# Patient Record
Sex: Female | Born: 1970 | Race: White | Hispanic: No | Marital: Married | Smoking: Never smoker
Health system: Southern US, Community
[De-identification: ages and names within clinical notes are randomized; demographics above are authoritative.]

## PROBLEM LIST (undated history)

## (undated) DIAGNOSIS — I10 Essential (primary) hypertension: Secondary | ICD-10-CM

## (undated) HISTORY — PX: HERNIA REPAIR: SHX51

---

## 2007-07-04 ENCOUNTER — Ambulatory Visit: Payer: Self-pay | Admitting: Family Medicine

## 2007-10-31 ENCOUNTER — Emergency Department: Payer: Self-pay | Admitting: Emergency Medicine

## 2008-02-08 ENCOUNTER — Ambulatory Visit: Payer: Self-pay | Admitting: Gastroenterology

## 2008-02-25 ENCOUNTER — Emergency Department: Payer: Self-pay | Admitting: Emergency Medicine

## 2008-02-25 ENCOUNTER — Other Ambulatory Visit: Payer: Self-pay

## 2008-02-26 ENCOUNTER — Other Ambulatory Visit: Payer: Self-pay

## 2009-03-10 ENCOUNTER — Ambulatory Visit: Payer: Self-pay | Admitting: Internal Medicine

## 2009-08-07 ENCOUNTER — Ambulatory Visit: Payer: Self-pay | Admitting: Family Medicine

## 2009-08-15 ENCOUNTER — Ambulatory Visit: Payer: Self-pay | Admitting: Internal Medicine

## 2012-04-28 ENCOUNTER — Ambulatory Visit: Payer: Self-pay

## 2012-07-20 ENCOUNTER — Ambulatory Visit: Payer: Self-pay | Admitting: Family Medicine

## 2013-07-26 ENCOUNTER — Ambulatory Visit: Payer: Self-pay | Admitting: Family Medicine

## 2014-01-24 ENCOUNTER — Ambulatory Visit: Payer: Self-pay | Admitting: Family Medicine

## 2014-08-01 ENCOUNTER — Ambulatory Visit: Payer: Self-pay | Admitting: Family Medicine

## 2014-08-14 ENCOUNTER — Ambulatory Visit: Payer: Self-pay | Admitting: Family Medicine

## 2016-01-08 ENCOUNTER — Other Ambulatory Visit: Payer: Self-pay | Admitting: Family Medicine

## 2016-01-08 DIAGNOSIS — Z8639 Personal history of other endocrine, nutritional and metabolic disease: Secondary | ICD-10-CM

## 2016-01-16 ENCOUNTER — Ambulatory Visit
Admission: RE | Admit: 2016-01-16 | Discharge: 2016-01-16 | Disposition: A | Discharge status: Home / Self Care | Payer: PRIVATE HEALTH INSURANCE | Source: Ambulatory Visit | Attending: Family Medicine | Admitting: Family Medicine

## 2016-01-16 DIAGNOSIS — Z8639 Personal history of other endocrine, nutritional and metabolic disease: Secondary | ICD-10-CM | POA: Insufficient documentation

## 2016-01-16 DIAGNOSIS — E041 Nontoxic single thyroid nodule: Secondary | ICD-10-CM | POA: Insufficient documentation

## 2017-06-16 IMAGING — US US THYROID
1 series · 14 of 25 positions shown · non-contrast
Comparison: 08/07/2009

CLINICAL DATA: History of thyroid nodule.

EXAM:
THYROID ULTRASOUND
TECHNIQUE: Ultrasound examination of the thyroid gland and adjacent soft
tissues was performed.

[Series 1: us thyroid · 0.07mm/px · 14 of 44 slices shown]
[im 1/44]
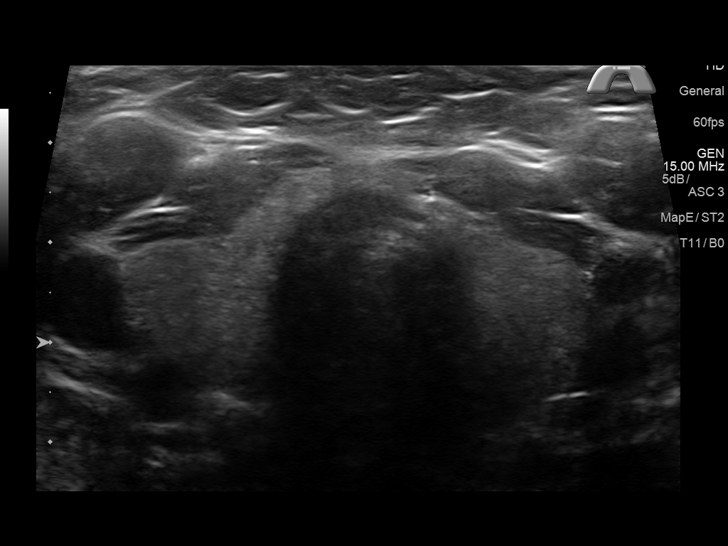
[im 4/44]
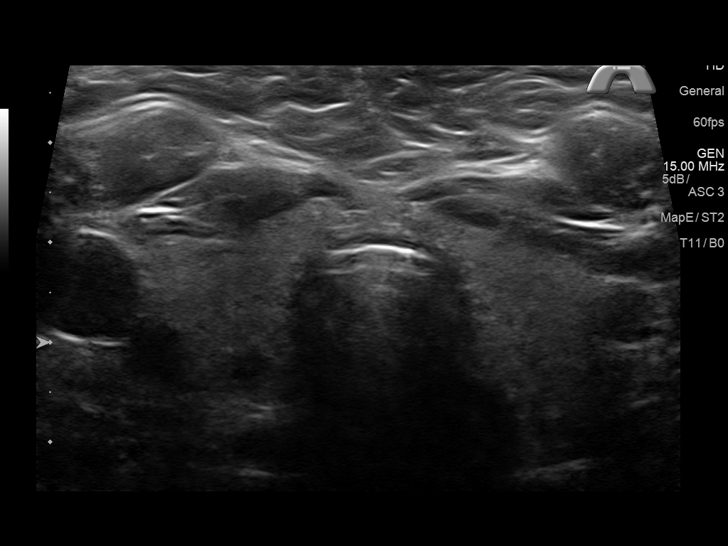
[im 8/44]
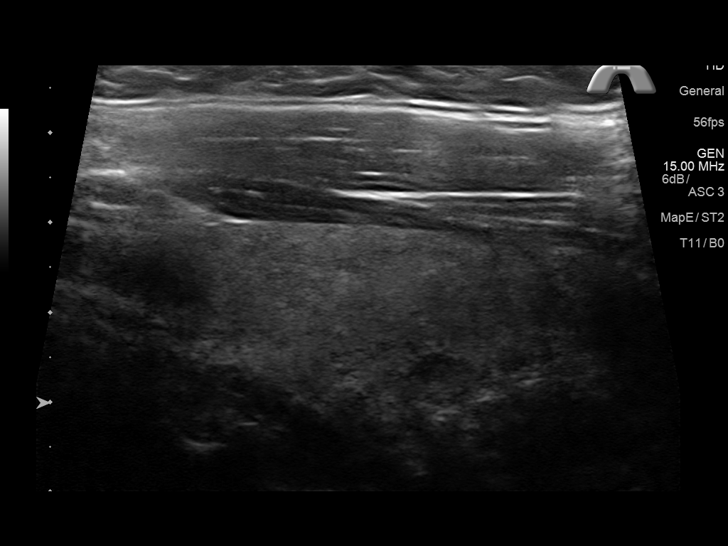
[im 11/44]
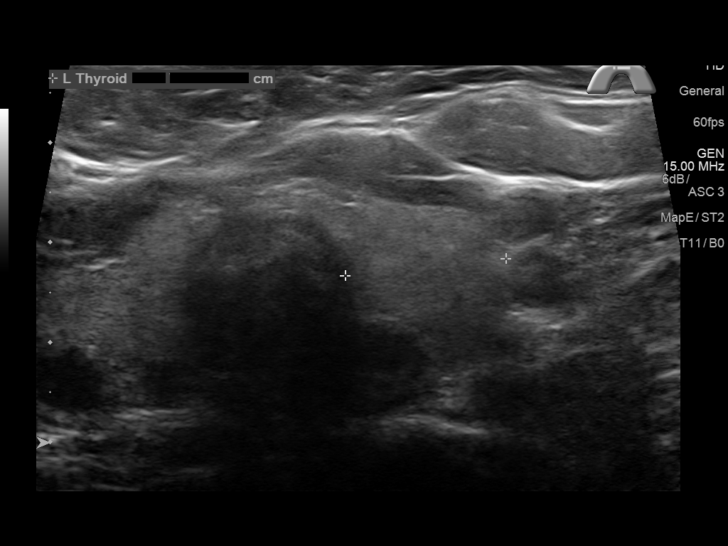
[im 15/44]
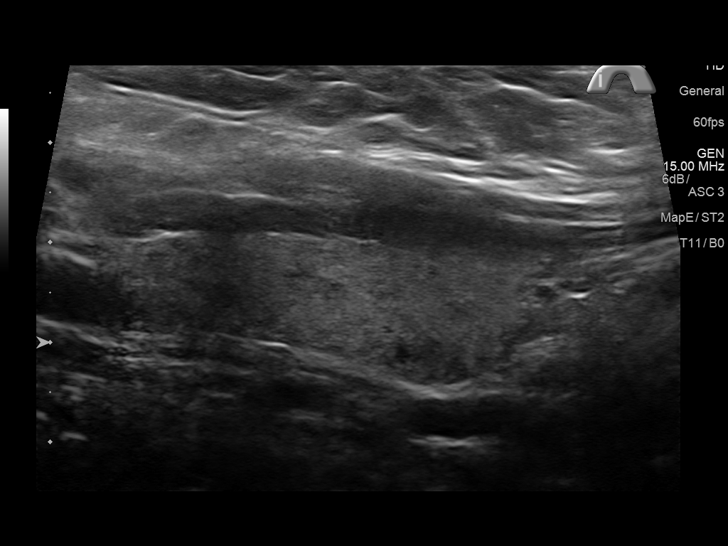
[im 17/44]
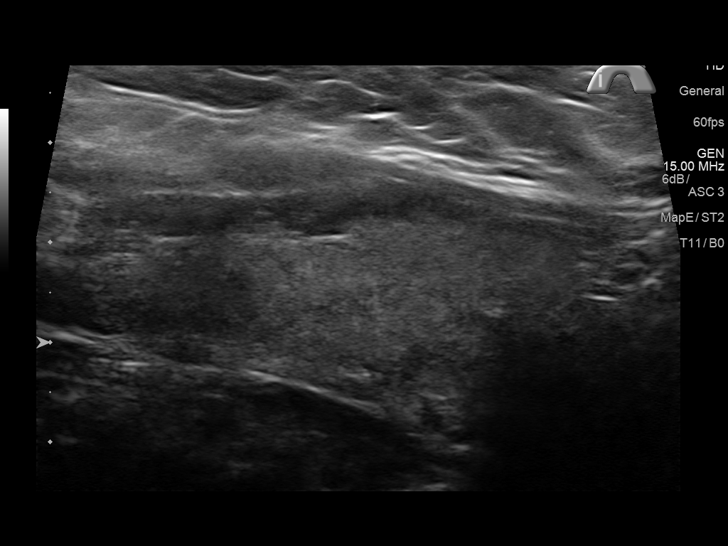
[im 20/44]
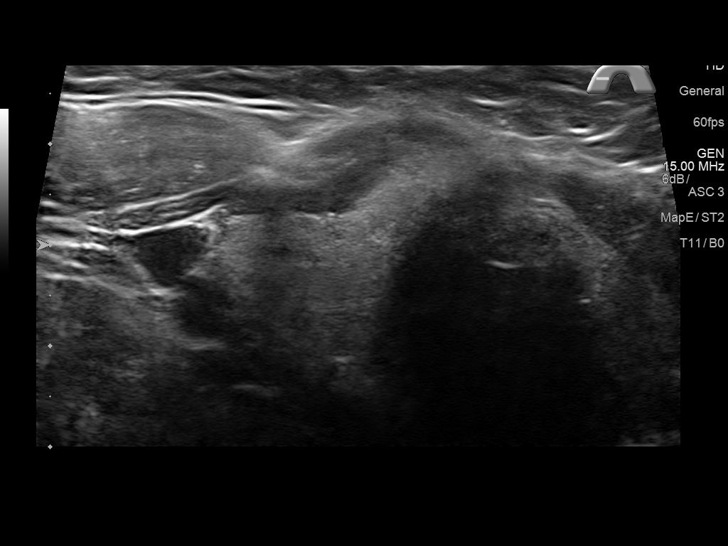
[im 24/44]
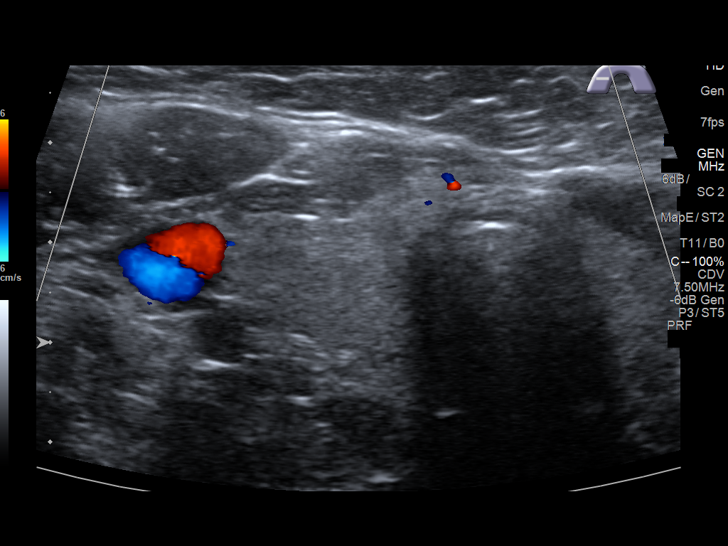
[im 27/44]
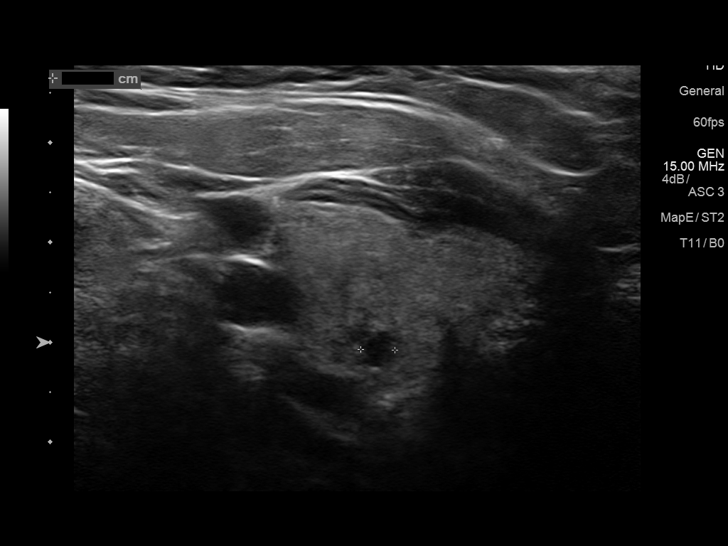
[im 29/44]
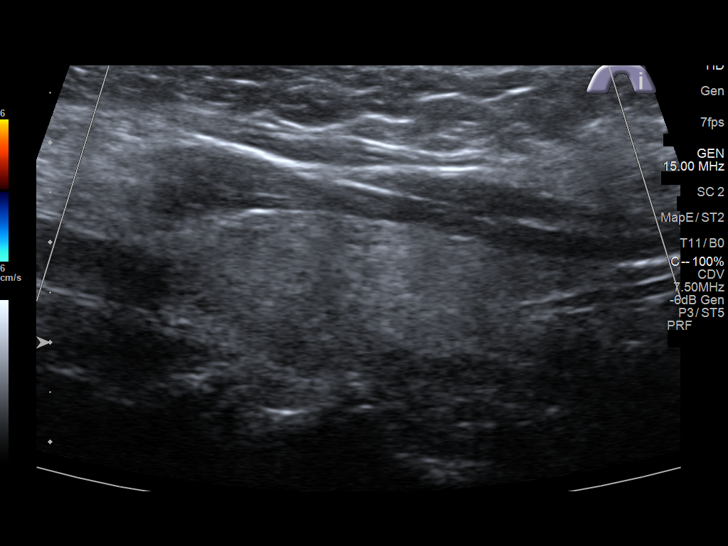
[im 33/44]
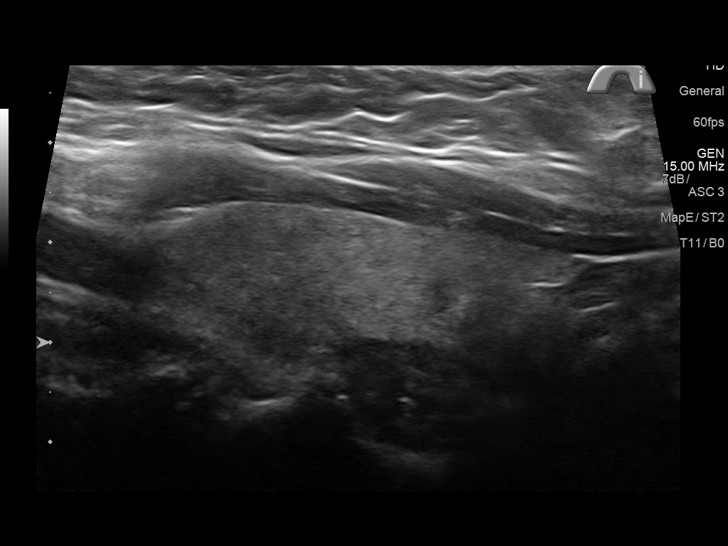
[im 36/44]
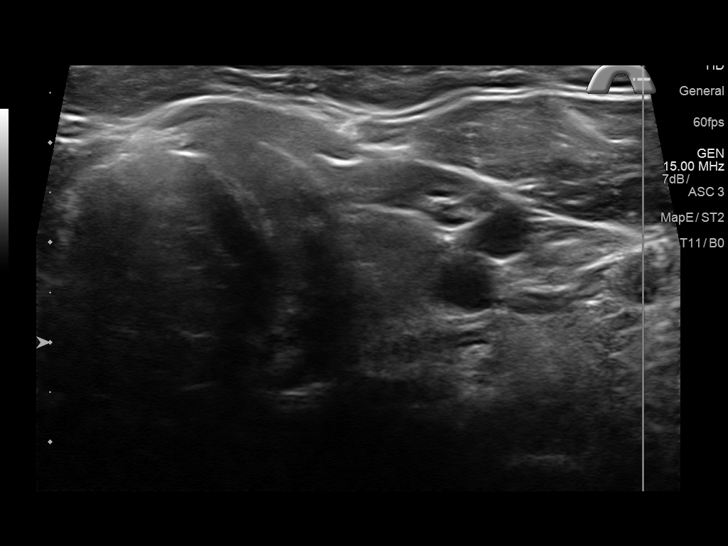
[im 40/44]
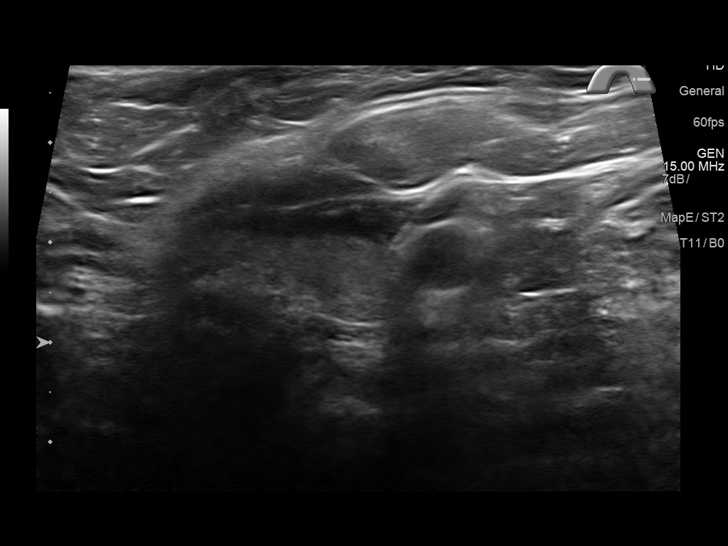
[im 44/44]
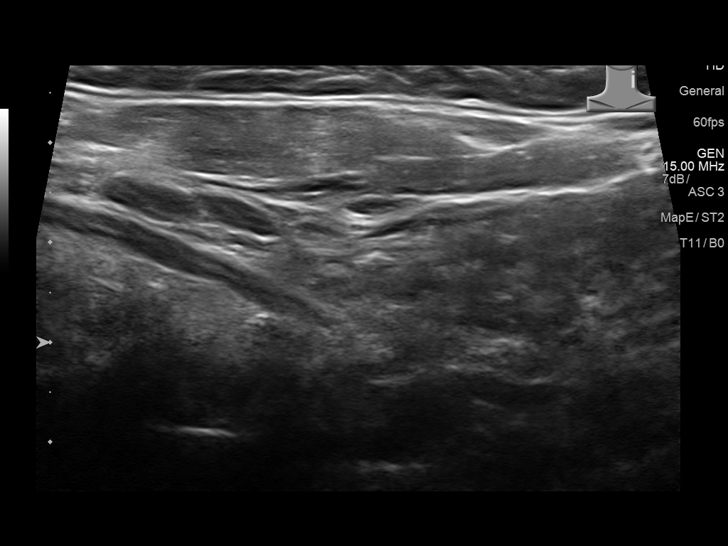

[14 of 25 positions shown; findings below may reference images not displayed]

FINDINGS: Right thyroid lobe

Measurements: 4.2 x 1.8 x 1.4 cm.

0.3 cm cyst in the right lobe is in a different location compared to
the previously identified cyst and has a benign appearance.
Previously noted cyst appears to have involuted.

Left thyroid lobe

Measurements: 3.6 x 1.3 x 1.6 cm.  No nodules visualized.

Isthmus

Thickness: 0.2 cm.  No nodules visualized.

Lymphadenopathy

None visualized.
IMPRESSION: Involution of previously identified 0.4 cm cystic nodule. A separate
0.3 cm nodule is identified which has a benign cystic appearance.

## 2018-09-28 ENCOUNTER — Encounter: Payer: Self-pay | Admitting: Emergency Medicine

## 2018-09-28 ENCOUNTER — Other Ambulatory Visit: Payer: Self-pay

## 2018-09-28 ENCOUNTER — Ambulatory Visit
Admission: EM | Admit: 2018-09-28 | Discharge: 2018-09-28 | Disposition: A | Discharge status: Home / Self Care | Payer: BLUE CROSS/BLUE SHIELD | Attending: Family Medicine | Admitting: Family Medicine

## 2018-09-28 DIAGNOSIS — J069 Acute upper respiratory infection, unspecified: Secondary | ICD-10-CM | POA: Insufficient documentation

## 2018-09-28 HISTORY — DX: Essential (primary) hypertension: I10

## 2018-09-28 NOTE — Discharge Instructions (Signed)
Over the counter medication as discussed. Rest. Drink plenty of fluids.  ° °Follow up with your primary care physician this week as needed. Return to Urgent care for new or worsening concerns.  ° °

## 2018-09-28 NOTE — ED Provider Notes (Signed)
[facility] ____________________________________________  Time seen: Approximately 10:25 AM  I have reviewed the triage vital signs and the nursing notes.   HISTORY  Chief Complaint Sinus Problem and Nasal Congestion   HPI Jennifer Fields is a 47 y.o. female presenting with husband at bedside for evaluation of runny nose, nasal congestion, sinus pressure.  Reports some intermittent coughing as well as left ear pain today.  Patient reports that she has a history of recurrent sinus infections and expresses concern of same.  Reports symptoms have been present for the last 3 days.  States otherwise prior to the last 3 days she has had intermittent nasal congestion consistent with baseline allergies.  Occasional scratchy throat.  Denies accompanying fevers.  Has continued to remain active, continues to eat and drink well.  States son home visiting just prior to her symptoms onset and states that he was sick with similar.  States sinus pressure is currently mild.  Has been using her home allergy nasal sprays as well as her Zyrtec.  Denies other aggravating leaving factors.  Denies chest pain, shortness of breath or abdominal pain.  Reports otherwise doing well denies other complaints.  Dione Housekeeperlmedo, Mario Ernesto, MD: PCP    Past Medical History:  Diagnosis Date  . Hypertension     There are no active problems to display for this patient.   Past Surgical History:  Procedure Laterality Date  . HERNIA REPAIR       No current facility-administered medications for this encounter.   Current Outpatient Medications:  .  LORazepam (ATIVAN) 1 MG tablet, Take by mouth., Disp: , Rfl:  .  losartan (COZAAR) 25 MG tablet, Take by mouth., Disp: , Rfl:  .  Multiple Vitamin (MULTIVITAMIN) tablet, Take by mouth., Disp: , Rfl:  .  RA KRILL OIL 500 MG CAPS, Take by mouth., Disp: , Rfl:  .  sertraline (ZOLOFT) 100 MG tablet, 200mg  daily, Disp: , Rfl:  .  Vitamin D, Ergocalciferol, (DRISDOL)  1.25 MG (50000 UT) CAPS capsule, , Disp: , Rfl:   Allergies Ciprofloxacin; Penicillins; Desloratadine; and Other  Family History  Problem Relation Age of Onset  . Hypertension Mother   . Atrial fibrillation Mother   . Hashimoto's thyroiditis Mother   . Hypertension Father     Social History Social History   Tobacco Use  . Smoking status: Never Smoker  . Smokeless tobacco: Never Used  Substance Use Topics  . Alcohol use: Never    Frequency: Never  . Drug use: Never    Review of Systems Constitutional: No fever ENT: as above Cardiovascular: Denies chest pain. Respiratory: Denies shortness of breath. Gastrointestinal: No abdominal pain.   Musculoskeletal: Negative for back pain. Skin: Negative for rash.   ____________________________________________   PHYSICAL EXAM:  VITAL SIGNS: ED Triage Vitals  Enc Vitals Group     BP 09/28/18 0937 (!) 141/84     Pulse Rate 09/28/18 0937 82     Resp 09/28/18 0937 14     Temp 09/28/18 0937 98.1 F (36.7 C)     Temp Source 09/28/18 0937 Oral     SpO2 09/28/18 0937 100 %     Weight 09/28/18 0933 205 lb (93 kg)     Height 09/28/18 0933 5\' 8"  (1.727 m)     Head Circumference --      Peak Flow --      Pain Score 09/28/18 0932 4     Pain Loc --      Pain  Edu? --      Excl. in GC? --     Constitutional: Alert and oriented. Well appearing and in no acute distress. Eyes: Conjunctivae are normal.  Head: Atraumatic.  Mild frontal bilateral sinus tenderness palpation.  No maxillary sinus tenderness.  No swelling. No erythema.  Ears: no erythema, normal TMs bilaterally.   Nose:Nasal congestion with clear rhinorrhea  Mouth/Throat: Mucous membranes are moist. No pharyngeal erythema. No tonsillar swelling or exudate.  Neck: No stridor.  No cervical spine tenderness to palpation. Hematological/Lymphatic/Immunilogical: No cervical lymphadenopathy. Cardiovascular: Normal rate, regular rhythm. Grossly normal heart sounds.  Good  peripheral circulation. Respiratory: Normal respiratory effort.  No retractions. No wheezes, rales or rhonchi. Good air movement.  Musculoskeletal: Ambulatory with steady gait. Neurologic:  Normal speech and language. No gait instability. Skin:  Skin appears warm, dry and intact. No rash noted. Psychiatric: Mood and affect are normal. Speech and behavior are normal. ___________________________________________   LABS (all labs ordered are listed, but only abnormal results are displayed)  Labs Reviewed - No data to display ____________________________________________   PROCEDURES Procedures    INITIAL IMPRESSION / ASSESSMENT AND PLAN / ED COURSE  Pertinent labs & imaging results that were available during my care of the patient were reviewed by me and considered in my medical decision making (see chart for details).  Well-appearing patient.  No acute distress. Suspect viral upper respiratory infection.  Counseled regarding home supportive care, monitoring and follow-up as needed.  Discussed follow up with Primary care physician this week. Discussed follow up and return parameters including no resolution or any worsening concerns. Patient verbalized understanding and agreed to plan.   ____________________________________________   FINAL CLINICAL IMPRESSION(S) / ED DIAGNOSES  Final diagnoses:  Upper respiratory tract infection, unspecified type     ED Discharge Orders    None       Note: This dictation was prepared with Dragon dictation along with smaller phrase technology. Any transcriptional errors that result from this process are unintentional.         Renford DillsMiller, [place] Alexa, NP 09/28/18 1027

## 2018-09-28 NOTE — ED Triage Notes (Signed)
Patient c/o sinus congestion and stuffy nose and nasal congestion that started yesterday.  Patient reports cold symptoms about 3 days prior to the sinus symptoms. Patient denies fevers.

## 2019-11-10 ENCOUNTER — Encounter: Payer: Self-pay | Admitting: Emergency Medicine

## 2019-11-10 ENCOUNTER — Ambulatory Visit
Admission: EM | Admit: 2019-11-10 | Discharge: 2019-11-10 | Disposition: A | Discharge status: Home / Self Care | Payer: BC Managed Care – PPO | Attending: Family Medicine | Admitting: Family Medicine

## 2019-11-10 ENCOUNTER — Other Ambulatory Visit: Payer: Self-pay

## 2019-11-10 DIAGNOSIS — M7918 Myalgia, other site: Secondary | ICD-10-CM | POA: Diagnosis not present

## 2019-11-10 DIAGNOSIS — J0100 Acute maxillary sinusitis, unspecified: Secondary | ICD-10-CM | POA: Diagnosis not present

## 2019-11-10 MED ORDER — MELOXICAM 15 MG PO TABS: 15.0000 mg | ORAL_TABLET | Freq: Every day | ORAL | 0 refills | Status: DC | PRN

## 2019-11-10 MED ORDER — DOXYCYCLINE HYCLATE 100 MG PO CAPS: 100.0000 mg | ORAL_CAPSULE | Freq: Two times a day (BID) | ORAL | 0 refills | Status: DC

## 2019-11-10 MED ORDER — TIZANIDINE HCL 4 MG PO TABS: 4.0000 mg | ORAL_TABLET | Freq: Three times a day (TID) | ORAL | 0 refills | Status: AC | PRN

## 2019-11-10 NOTE — ED Provider Notes (Signed)
[facility]    CSN: [id number] Arrival date & time: 11/10/19  1572  History   Chief Complaint Chief Complaint  Patient presents with  . Neck Pain  . Nasal Congestion   HPI  49 year old female presents with the above complaints.  Patient reports ongoing musculoskeletal pain for the past 3 weeks.  Reports neck pain and upper back pain. Feels tense. Started after change in the weather.  Pain 8/10 in severity.  No relieving factors.  She has taken Tylenol without resolution.  Additionally, patient states that she has had ongoing sinus pain and pressure as well as postnasal drip for the past 3 weeks.  Likely coincided with recent weather changes.  No fever.  She has had no relief.  Seems to be persistent.  No other associated symptoms.  No other complaints at this time.  Past Medical History:  Diagnosis Date  . Hypertension    Past Surgical History:  Procedure Laterality Date  . HERNIA REPAIR     OB History   No obstetric history on file.    Home Medications    Prior to Admission medications   Medication Sig Start Date End Date Taking? Authorizing Provider  LORazepam (ATIVAN) 1 MG tablet Take by mouth. 05/24/08  Yes [provider]  losartan (COZAAR) 25 MG tablet Take by mouth. 12/14/17  Yes [provider]  Multiple Vitamin (MULTIVITAMIN) tablet Take by mouth.   Yes [provider]  RA KRILL OIL 500 MG CAPS Take by mouth.   Yes [provider]  sertraline (ZOLOFT) 100 MG tablet 200mg  daily 09/07/09  Yes [provider]  Vitamin D, Ergocalciferol, (DRISDOL) 1.25 MG (50000 UT) CAPS capsule  07/10/14  Yes [provider]  doxycycline (VIBRAMYCIN) 100 MG capsule Take 1 capsule (100 mg total) by mouth 2 (two) times daily. 11/10/19   Coral Spikes, DO  meloxicam (MOBIC) 15 MG tablet Take 1 tablet (15 mg total) by mouth daily as needed for pain. 11/10/19   Coral Spikes, DO  tiZANidine (ZANAFLEX) 4 MG tablet Take 1 tablet (4 mg  total) by mouth every 8 (eight) hours as needed for muscle spasms. 11/10/19   Coral Spikes, DO    Family History Family History  Problem Relation Age of Onset  . Hypertension Mother   . Atrial fibrillation Mother   . Hashimoto's thyroiditis Mother   . Hypertension Father     Social History Social History   Tobacco Use  . Smoking status: Never Smoker  . Smokeless tobacco: Never Used  Substance Use Topics  . Alcohol use: Never  . Drug use: Never     Allergies   Ciprofloxacin, Penicillins, Desloratadine, and Other   Review of Systems Review of Systems  Constitutional: Negative for fever.  HENT: Positive for postnasal drip, sinus pressure and sinus pain.   Musculoskeletal: Positive for back pain and neck pain.   Physical Exam Triage Vital Signs ED Triage Vitals  Enc Vitals Group     BP 11/10/19 1029 (!) 151/85     Pulse Rate 11/10/19 1029 95     Resp 11/10/19 1029 18     Temp 11/10/19 1029 98.4 F (36.9 C)     Temp Source 11/10/19 1029 Oral     SpO2 11/10/19 1029 100 %     Weight 11/10/19 1027 205 lb (93 kg)     Height 11/10/19 1027 5\' 8"  (1.727 m)     Head Circumference --  Peak Flow --      Pain Score 11/10/19 1026 8     Pain Loc --      Pain Edu? --      Excl. in GC? --    Updated Vital Signs BP (!) 151/85 (BP Location: Right Arm)   Pulse 95   Temp 98.4 F (36.9 C) (Oral)   Resp 18   Ht 5\' 8"  (1.727 m)   Wt 93 kg   LMP 10/17/2019   SpO2 100%   BMI 31.17 kg/m   Visual Acuity Right Eye Distance:   Left Eye Distance:   Bilateral Distance:    Right Eye Near:   Left Eye Near:    Bilateral Near:     Physical Exam Vitals and nursing note reviewed.  Constitutional:      General: She is not in acute distress.    Appearance: Normal appearance. She is not ill-appearing.  HENT:     Head: Normocephalic and atraumatic.     Right Ear: Tympanic membrane normal.     Left Ear: Tympanic membrane normal.     Nose:     Comments: Left maxillary  tenderness to palpation.    Mouth/Throat:     Pharynx: Oropharynx is clear. No oropharyngeal exudate.  Eyes:     General:        Right eye: No discharge.        Left eye: No discharge.     Conjunctiva/sclera: Conjunctivae normal.  Cardiovascular:     Rate and Rhythm: Normal rate and regular rhythm.     Heart sounds: No murmur.  Pulmonary:     Effort: Pulmonary effort is normal.     Breath sounds: Normal breath sounds. No wheezing, rhonchi or rales.  Musculoskeletal:     Comments: Tension/spasm of the trapezius muscle bilaterally.  Patient with tenderness around the scapula.  Neurological:     Mental Status: She is alert.  Psychiatric:        Mood and Affect: Mood normal.        Behavior: Behavior normal.    UC Treatments / Results  Labs (all labs ordered are listed, but only abnormal results are displayed) Labs Reviewed - No data to display  EKG   Radiology No results found.  Procedures Procedures (including critical care time)  Medications Ordered in UC Medications - No data to display  Initial Impression / Assessment and Plan / UC Course  I have reviewed the triage vital signs and the nursing notes.  Pertinent labs & imaging results that were available during my care of the patient were reviewed by me and considered in my medical decision making (see chart for details).    49 year old female presents with 2 acute problems. #1.  Acute maxillary sinusitis.  Treating with doxycycline given penicillin allergy.  #2.  Musculoskeletal pain.  Primarily muscle spasm.  Treating with Zanaflex.  Meloxicam as directed.  Advised to take PPI with the meloxicam as patient reports a history of ulcer.  Final Clinical Impressions(s) / UC Diagnoses   Final diagnoses:  Acute maxillary sinusitis, recurrence not specified  Musculoskeletal pain     Discharge Instructions     Rest.  Heat.  Medications as prescribed.  Take care  Dr. 44    ED Prescriptions     Medication Sig Dispense Auth. Provider   tiZANidine (ZANAFLEX) 4 MG tablet Take 1 tablet (4 mg total) by mouth every 8 (eight) hours as needed for muscle spasms. 30 tablet [place], Luceni  G, DO   meloxicam (MOBIC) 15 MG tablet Take 1 tablet (15 mg total) by mouth daily as needed for pain. 30 tablet Jyquan Kenley G, DO   doxycycline (VIBRAMYCIN) 100 MG capsule Take 1 capsule (100 mg total) by mouth 2 (two) times daily. 14 capsule Everlene Other G, DO     PDMP not reviewed this encounter.   Tommie Sams, [place] 11/10/19 1144

## 2019-11-10 NOTE — ED Triage Notes (Signed)
Patient c/o musculoskeletal pain in her back and neck area that started 3 weeks ago.  She also reports sinus drainage x 3 weeks. Denies fever.

## 2019-11-10 NOTE — Discharge Instructions (Signed)
Rest.  Heat.  Medications as prescribed.  Take care  Dr. Cindie Rajagopalan  

## 2019-12-02 ENCOUNTER — Other Ambulatory Visit: Payer: Self-pay | Admitting: Family Medicine

## 2021-08-14 ENCOUNTER — Encounter: Payer: Self-pay | Admitting: Emergency Medicine

## 2021-08-14 ENCOUNTER — Ambulatory Visit
Admission: EM | Admit: 2021-08-14 | Discharge: 2021-08-14 | Disposition: A | Discharge status: Home / Self Care | Payer: Managed Care, Other (non HMO) | Attending: Emergency Medicine | Admitting: Emergency Medicine

## 2021-08-14 ENCOUNTER — Other Ambulatory Visit: Payer: Self-pay

## 2021-08-14 DIAGNOSIS — S61211A Laceration without foreign body of left index finger without damage to nail, initial encounter: Secondary | ICD-10-CM | POA: Diagnosis not present

## 2021-08-14 NOTE — Discharge Instructions (Addendum)
Return to clinic in 10 days for suture removal, sooner for any signs of infection.  In the meantime, 1000 milligrams of Tylenol 3-4 times a day as needed for pain, ice, elevation.

## 2021-08-14 NOTE — ED Provider Notes (Signed)
HPI  SUBJECTIVE:  AHSLEY Fields is a left-handed 50 y.o. female who presents with a laceration to her left index finger sustained on a clean, new cloth rotary cutter immediately prior to arrival.  She denies foreign body sensation, pain, numbness or tingling, limitation of motion.  She tried ice, applying antibiotic cream with gauze and came here.  There are no aggravating or alleviating factors.  She has no past medical history.  She is not on any anticoagulants or antiplatelets.  Tetanus was 9 years ago.  States that she is due for her next tetanus shot next October/November.  PMD: [facility]    Past Medical History:  Diagnosis Date   Hypertension     Past Surgical History:  Procedure Laterality Date   HERNIA REPAIR      Family History  Problem Relation Age of Onset   Hypertension Mother    Atrial fibrillation Mother    Hashimoto's thyroiditis Mother    Hypertension Father     Social History   Tobacco Use   Smoking status: Never   Smokeless tobacco: Never  Vaping Use   Vaping Use: Never used  Substance Use Topics   Alcohol use: Never   Drug use: Never    No current facility-administered medications for this encounter.  Current Outpatient Medications:    LORazepam (ATIVAN) 1 MG tablet, Take by mouth., Disp: , Rfl:    losartan (COZAAR) 25 MG tablet, Take by mouth., Disp: , Rfl:    Multiple Vitamin (MULTIVITAMIN) tablet, Take by mouth., Disp: , Rfl:    RA KRILL OIL 500 MG CAPS, Take by mouth., Disp: , Rfl:    sertraline (ZOLOFT) 100 MG tablet, 200mg  daily, Disp: , Rfl:    tiZANidine (ZANAFLEX) 4 MG tablet, Take 1 tablet (4 mg total) by mouth every 8 (eight) hours as needed for muscle spasms., Disp: 30 tablet, Rfl: 0   Vitamin D, Ergocalciferol, (DRISDOL) 1.25 MG (50000 UT) CAPS capsule, , Disp: , Rfl:   Allergies  Allergen Reactions   Ciprofloxacin Other (See Comments) and Rash    Other Reaction: RED DOTS ON ARMS AND CHEST Other Reaction: RED DOTS ON  ARMS AND CHEST    Penicillins Rash   Desloratadine Rash   Other Rash     ROS  As noted in HPI.   Physical Exam  BP (!) 159/81 (BP Location: Right Arm)   Pulse 72   Temp 97.7 F (36.5 C) (Oral)   Resp 18   SpO2 98%   Constitutional: Well developed, well nourished, no acute distress Eyes:  EOMI, conjunctiva normal bilaterally HENT: Normocephalic, atraumatic,mucus membranes moist Respiratory: Normal inspiratory effort Cardiovascular: Normal rate GI: nondistended skin: No rash, skin intact Musculoskeletal: 5 cm clean, linear laceration distal, middle phalanx left index finger.  Two-point discrimination intact.  FDP/FDS intact.  Extension intact at the PIP and DIP intact., No bone exposure.  No foreign body seen.      Neurologic: Alert & oriented x 3, no focal neuro deficits Psychiatric: Speech and behavior appropriate   ED Course   Medications - No data to display  No orders of the defined types were placed in this encounter.   No results found for this or any previous visit (from the past 24 hour(s)). No results found.  ED Clinical Impression  1. Laceration of left index finger without foreign body without damage to nail, initial encounter      ED Assessment/Plan  Procedure note: Anesthetized finger doing a digital block  with 1.5 cc of 1% plain lidocaine with adequate anesthesia.  Then had patient scrub out wound extensively with chlorhexidine soap and tap water.  Then cleaned again with chlorhexidine.  Placed nine 5-0 interrupted Prolene sutures with close approximation of wound edges.  Dressing placed.  Patient tolerated procedure well.  Patient does not need tetanus updated.  Do not think that we need to send her prophylactic antibiotics because this was a clean, noncontaminated wound that was irrigated extensively.  Return to clinic in 10 days for suture removal, sooner for any signs of infection.  In the meantime, 1000 milligrams of Tylenol 3-4 times a  day as needed for pain, ice, elevation.   Discussed  MDM, treatment plan, and plan for follow-up with patient. patient agrees with plan.   No orders of the defined types were placed in this encounter.     *This clinic note was created using Dragon dictation software. Therefore, there may be occasional mistakes despite careful proofreading.  ?    Domenick Gong, MD 08/15/21 [phone]

## 2021-08-14 NOTE — ED Triage Notes (Signed)
Pt presents today with laceration to left index finger. She reports cutting it with fabric cutter approx 30 min ago. Pressure applied to finger in triage. Last Tetanus 9 years ago.

## 2021-08-23 ENCOUNTER — Other Ambulatory Visit: Payer: Self-pay

## 2021-08-23 ENCOUNTER — Ambulatory Visit
Admission: RE | Admit: 2021-08-23 | Discharge: 2021-08-23 | Disposition: A | Discharge status: Home / Self Care | Payer: Managed Care, Other (non HMO) | Source: Ambulatory Visit | Attending: Family Medicine | Admitting: Family Medicine

## 2021-08-23 NOTE — ED Triage Notes (Signed)
Provider in to see pt, after removing 2 sutures it was still able to separate. Provider advised pt to wait full 14 days. Pt will return on day 14
